# Patient Record
Sex: Female | Born: 1995 | Race: Black or African American | Hispanic: No | Marital: Single | Smoking: Never smoker
Health system: Southern US, Community
[De-identification: ages and names within clinical notes are randomized; demographics above are authoritative.]

---

## 2017-12-16 ENCOUNTER — Encounter (HOSPITAL_COMMUNITY): Payer: Self-pay | Admitting: Emergency Medicine

## 2017-12-16 ENCOUNTER — Ambulatory Visit (HOSPITAL_COMMUNITY)
Admission: EM | Admit: 2017-12-16 | Discharge: 2017-12-16 | Disposition: A | Discharge status: Home / Self Care | Payer: BLUE CROSS/BLUE SHIELD | Attending: Internal Medicine | Admitting: Internal Medicine

## 2017-12-16 ENCOUNTER — Other Ambulatory Visit: Payer: Self-pay

## 2017-12-16 DIAGNOSIS — R300 Dysuria: Secondary | ICD-10-CM | POA: Insufficient documentation

## 2017-12-16 LAB — POCT URINALYSIS DIP (DEVICE)
Bilirubin Urine: NEGATIVE
GLUCOSE, UA: NEGATIVE mg/dL
Hgb urine dipstick: NEGATIVE
KETONES UR: NEGATIVE mg/dL
Leukocytes, UA: NEGATIVE
NITRITE: NEGATIVE
PH: 6 (ref 5.0–8.0)
PROTEIN: NEGATIVE mg/dL
Specific Gravity, Urine: 1.025 (ref 1.005–1.030)
UROBILINOGEN UA: 0.2 mg/dL (ref 0.0–1.0)

## 2017-12-16 LAB — POCT PREGNANCY, URINE: PREG TEST UR: NEGATIVE

## 2017-12-16 NOTE — ED Triage Notes (Signed)
Pt c/o painful urination, frequent urination. C/o pelvic cramps. x3 days.

## 2017-12-16 NOTE — ED Notes (Signed)
Urine specimen obtained and in lab 

## 2017-12-16 NOTE — Discharge Instructions (Addendum)
Tests for common causes of urinary irritation/discomfort are pending.  The urgent care will contact you if further treatment is needed.  A handout about some of the causes of urinary discomfort is attached.  Recheck or followup with student health as needed.

## 2017-12-16 NOTE — ED Provider Notes (Signed)
[facility]    CSN: [id number] Arrival date & time: 12/16/17  1217     History   Chief Complaint Chief Complaint  Patient presents with  . Dysuria    HPI Amanda Mckee is a 22 y.o. female.   She presents today with onset of dysuria and frequency yesterday, small volumes.  She had a UTI, treated with antibiotics at [facility] 2 weeks ago, symptoms appeared to resolve, but then recurred several days later.  She was treated with Diflucan and metronidazole for bacterial vaginosis.  Now having symptoms again.  No nausea/vomiting, no diarrhea.  No unusual vaginal discharge.  No fever.    HPI  History reviewed. No pertinent past medical history.  History reviewed. No pertinent surgical history.   Home Medications    Prior to Admission medications   Medication Sig Start Date End Date Taking? Authorizing Provider  Levonorgestrel-Ethinyl Estrad (LEVORA 0.15/30, 28, PO) Take by mouth.   Yes [provider]    Family History No family history on file.  Social History Social History   Tobacco Use  . Smoking status: Never Smoker  Substance Use Topics  . Alcohol use: Yes  . Drug use: No     Allergies   Patient has no known allergies.   Review of Systems Review of Systems  All other systems reviewed and are negative.    Physical Exam Triage Vital Signs ED Triage Vitals [12/16/17 1316]  Enc Vitals Group     BP 115/76     Pulse Rate 73     Resp 14     Temp 98.5 F (36.9 C)     Temp src      SpO2 97 %     Weight      Height      Pain Score      Pain Loc    Updated Vital Signs BP 115/76   Pulse 73   Temp 98.5 F (36.9 C)   Resp 14   LMP 11/21/2017   SpO2 97%   Physical Exam  Constitutional: She is oriented to person, place, and time. No distress.  Alert, nicely groomed  HENT:  Head: Atraumatic.  Eyes:  Conjugate gaze, no eye redness/drainage  Neck: Neck supple.  Cardiovascular: Normal rate.  Pulmonary/Chest: No  respiratory distress.  Lungs clear, symmetric breath sounds  Abdominal: She exhibits no distension.  Musculoskeletal: Normal range of motion.  No leg swelling  Neurological: She is alert and oriented to person, place, and time.  Skin: Skin is warm and dry.  No cyanosis  Nursing note and vitals reviewed.    UC Treatments / Results  Labs Results for orders placed or performed during the hospital encounter of 12/16/17  Urine culture  Result Value Ref Range   Specimen Description URINE, RANDOM    Special Requests      NONE Performed at [facility], [address] [place], [place] [address]    Culture MULTIPLE SPECIES PRESENT, SUGGEST RECOLLECTION (A)    Report Status 12/17/2017 FINAL   POCT urinalysis dip (device)  Result Value Ref Range   Glucose, UA NEGATIVE NEGATIVE mg/dL   Bilirubin Urine NEGATIVE NEGATIVE   Ketones, ur NEGATIVE NEGATIVE mg/dL   Specific Gravity, Urine 1.025 1.005 - 1.030   Hgb urine dipstick NEGATIVE NEGATIVE   pH 6.0 5.0 - 8.0   Protein, ur NEGATIVE NEGATIVE mg/dL   Urobilinogen, UA 0.2 0.0 - 1.0 mg/dL   Nitrite NEGATIVE NEGATIVE  Leukocytes, UA NEGATIVE NEGATIVE  Pregnancy, urine POC  Result Value Ref Range   Preg Test, Ur NEGATIVE NEGATIVE  Cervicovaginal ancillary only  Result Value Ref Range   Chlamydia Negative    Neisseria gonorrhea Negative    Trichomonas Negative     Procedures Procedures (including critical care time) None today  Final Clinical Impressions(s) / UC Diagnoses   Final diagnoses:  Dysuria   Tests for common causes of urinary irritation/discomfort are pending.  The urgent care will contact you if further treatment is needed.  A handout about some of the causes of urinary discomfort is attached.  Recheck or followup with student health as needed.    Isa RankinMurray, Colbe Viviano Wilson, MD 12/19/17 1225

## 2017-12-17 LAB — URINE CULTURE

## 2017-12-17 LAB — CERVICOVAGINAL ANCILLARY ONLY
CHLAMYDIA, DNA PROBE: NEGATIVE
NEISSERIA GONORRHEA: NEGATIVE
Trichomonas: NEGATIVE

## 2018-07-26 ENCOUNTER — Other Ambulatory Visit: Payer: Self-pay | Admitting: Family Medicine

## 2018-07-26 DIAGNOSIS — M79604 Pain in right leg: Secondary | ICD-10-CM

## 2018-07-31 ENCOUNTER — Ambulatory Visit
Admission: RE | Admit: 2018-07-31 | Discharge: 2018-07-31 | Disposition: A | Discharge status: Home / Self Care | Payer: BLUE CROSS/BLUE SHIELD | Source: Ambulatory Visit | Attending: Family Medicine | Admitting: Family Medicine

## 2018-07-31 ENCOUNTER — Other Ambulatory Visit: Payer: Self-pay | Admitting: Family Medicine

## 2018-07-31 DIAGNOSIS — M79604 Pain in right leg: Secondary | ICD-10-CM

## 2020-05-24 IMAGING — CT CT TIBIA FIBULA *R* W/O CM
3 of 4 series · 11 of 33 positions shown, 13 images · non-contrast
Comparison: None.

ADDENDUM:
Examination was reviewed with Dr. Robertto on 08/07/2018 at 2525
hours. Patient has pain along the proximal third of the tibia.
Re-examination of the proximal third of the tibia demonstrates a
small subtle lucency within the cortex of the proximal anterior
tibial diaphysis with trabecula coursing through the area of lucency
on the axial images. This area is felt to reflect a healing versus
healed stress related injury without a definite fracture cleft,
periosteal reaction, or intramedullary band of sclerosis. If there
is further clinical concern, an MRI is recommended of the right
tibia and fibula for characterization.
CLINICAL DATA: Right lower extremity pain.

EXAM:
CT OF THE LOWER RIGHT EXTREMITY WITHOUT CONTRAST
TECHNIQUE: Multidetector CT imaging of the right lower extremity was performed
according to the standard protocol.

[Series 4: lfov lower extremity 2.00 br60 s3 ax · axial · 0.26mm/px · z∈[+752,+1024]mm · 5 of 206 slices shown, 7 images]
[im 35/206  soft-tissue]
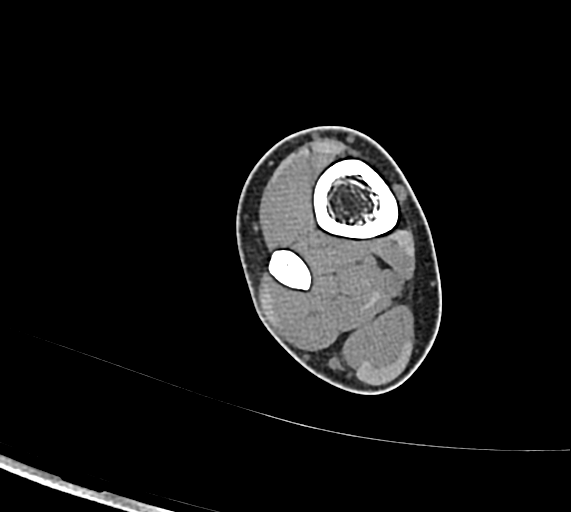
[im 35/206  bone]
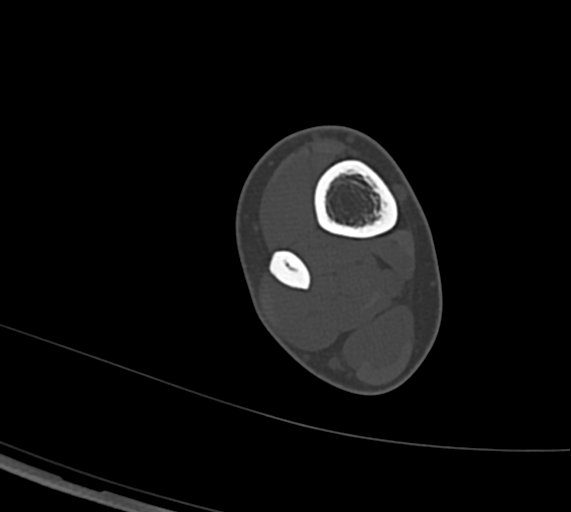
[im 69/206  bone]
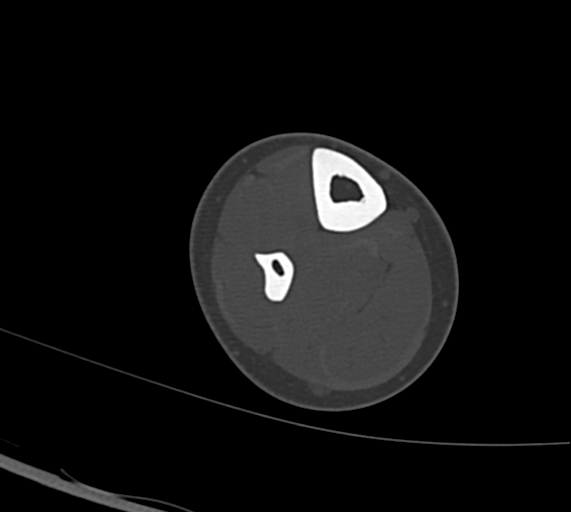
[im 103/206  bone]
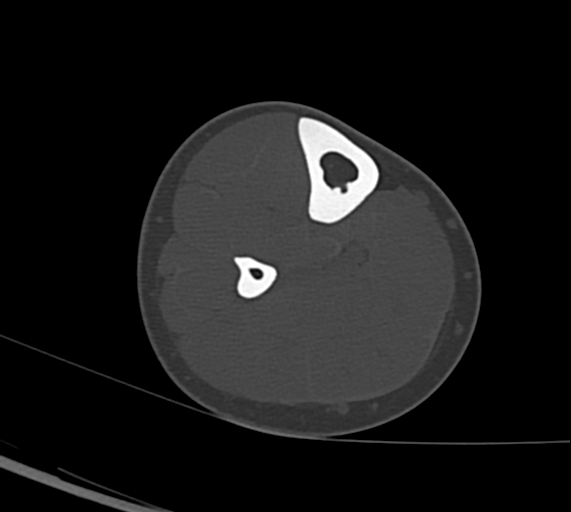
[im 137/206  bone]
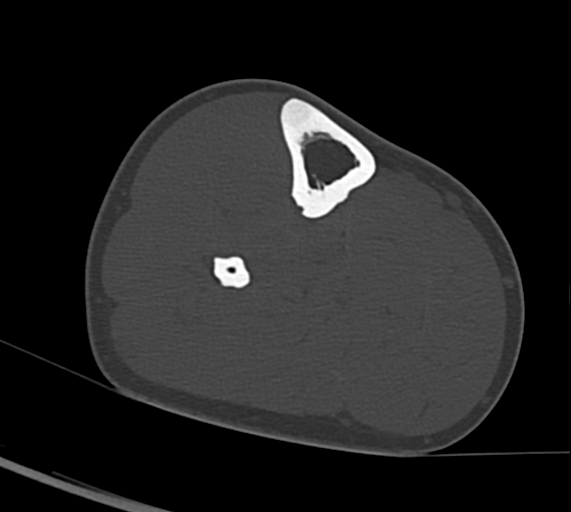
[im 171/206  soft-tissue]
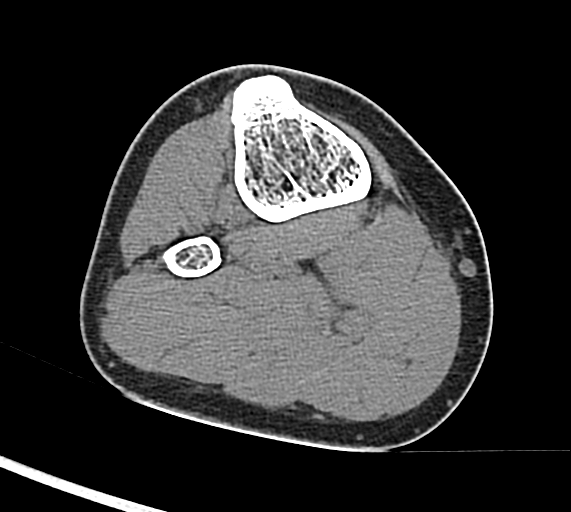
[im 171/206  bone]
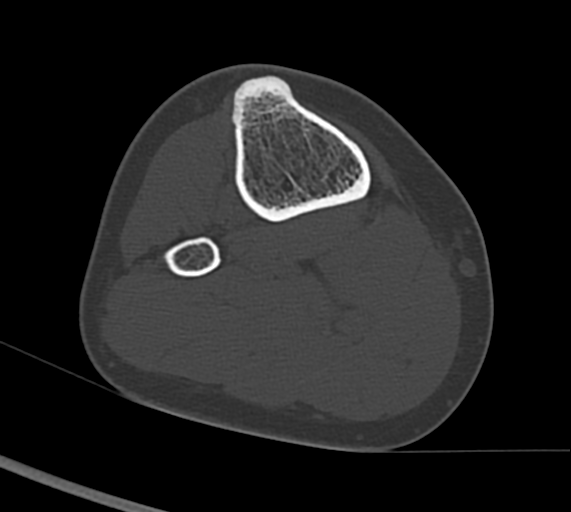

[Series 6: lfov lower extremity 2.00 br40 s3 ax · axial · 0.26mm/px · z∈[+752,+888]mm · 3 of 206 slices shown]
[im 35/206  bone]
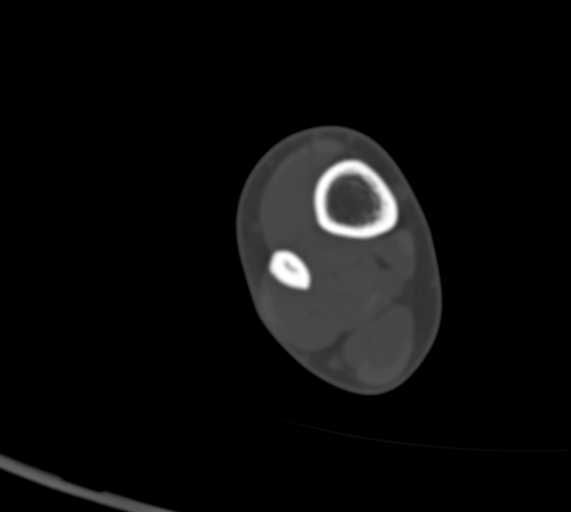
[im 69/206  bone]
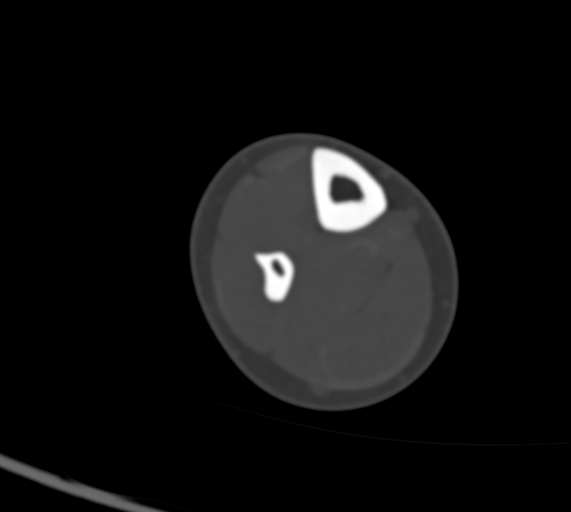
[im 103/206  bone]
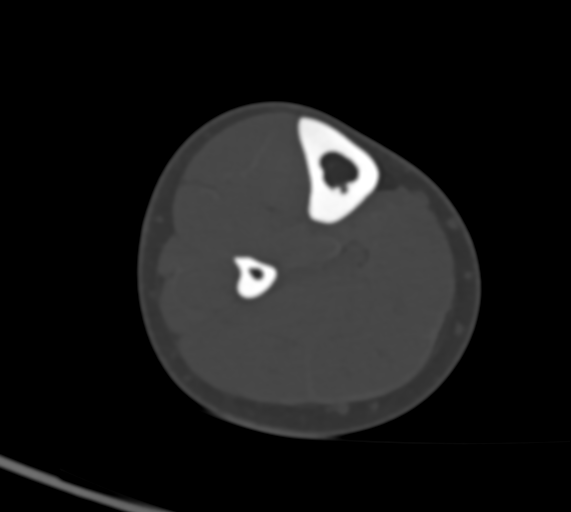

[Series 12: lfov lower extremity 2.00 br40 s3 cor soft · coronal · 0.29mm/px · 3 of 67 slices shown]
[im 14/67  bone]
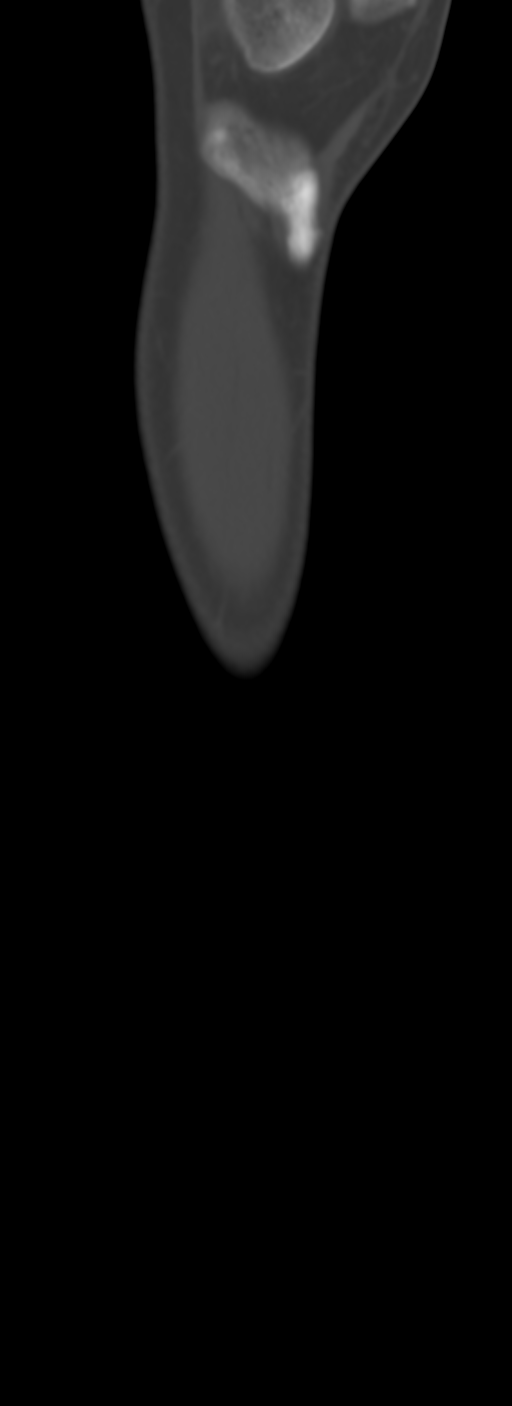
[im 27/67  bone]
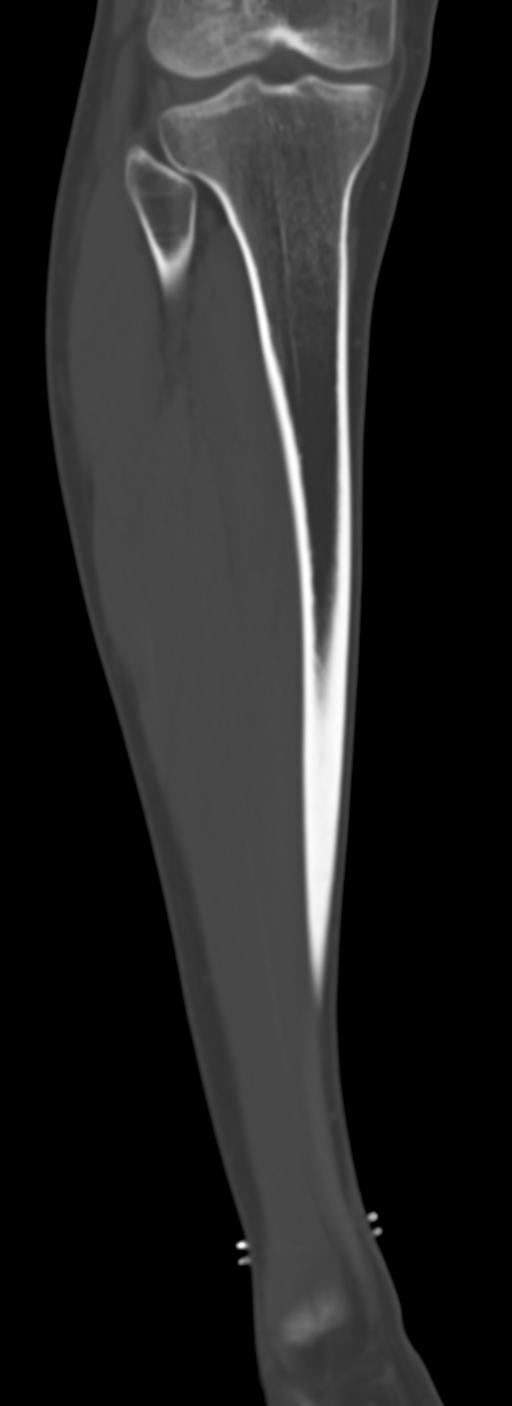
[im 40/67  bone]
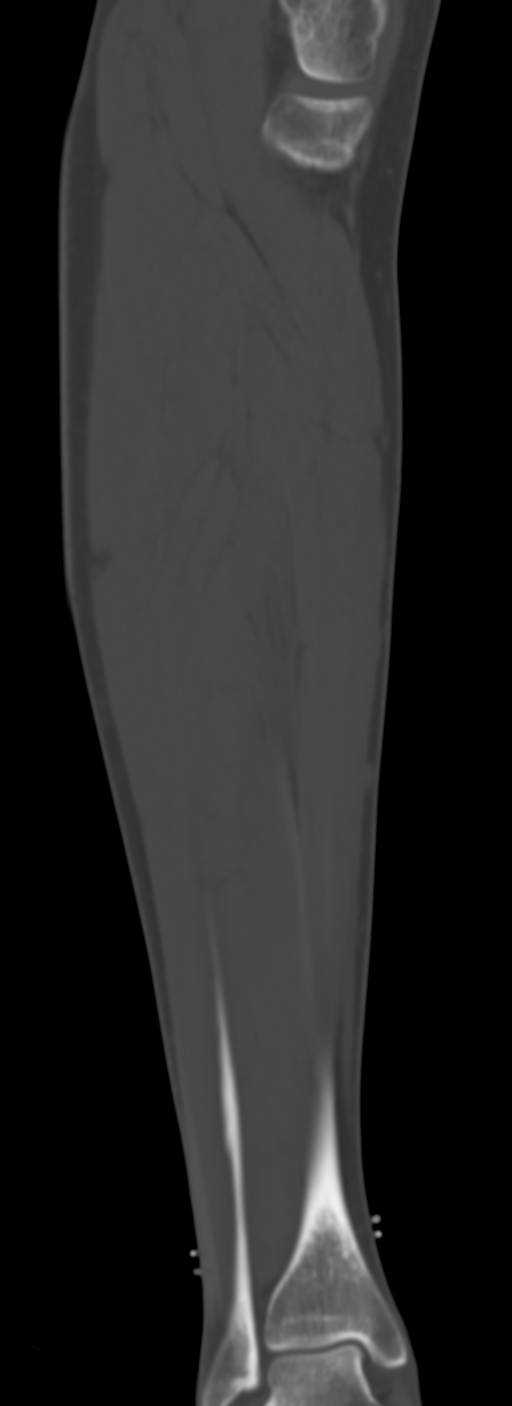

[11 of 33 positions shown; findings below may reference images not displayed]

FINDINGS: Bones/Joint/Cartilage

No fracture or dislocation. Normal alignment. No joint effusion. No
aggressive osseous lesion. No cortical thickening, periosteal
reaction or bone destruction.

Ligaments

Ligaments are suboptimally evaluated by CT.

Muscles and Tendons
Muscles are normal. No muscle atrophy. Flexor, extensor, peroneal
and Achilles tendons are grossly intact.

Soft tissue
No fluid collection or hematoma.  No soft tissue mass.
IMPRESSION: 1. No acute injury of the right tibia and fibula.
